# Patient Record
Sex: Male | Born: 1975 | Race: White | Hispanic: No | Marital: Single | Smoking: Current every day smoker
Health system: Southern US, Community
[De-identification: ages and names within clinical notes are randomized; demographics above are authoritative.]

## PROBLEM LIST (undated history)

## (undated) DIAGNOSIS — S069XAA Unspecified intracranial injury with loss of consciousness status unknown, initial encounter: Secondary | ICD-10-CM

## (undated) DIAGNOSIS — S069X9A Unspecified intracranial injury with loss of consciousness of unspecified duration, initial encounter: Secondary | ICD-10-CM

---

## 2016-10-20 ENCOUNTER — Emergency Department (HOSPITAL_COMMUNITY)
Admission: EM | Admit: 2016-10-20 | Discharge: 2016-10-21 | Disposition: A | Discharge status: Home / Self Care | Payer: Self-pay | Attending: Emergency Medicine | Admitting: Emergency Medicine

## 2016-10-20 ENCOUNTER — Encounter (HOSPITAL_COMMUNITY): Payer: Self-pay | Admitting: Emergency Medicine

## 2016-10-20 ENCOUNTER — Ambulatory Visit (HOSPITAL_COMMUNITY)
Admission: RE | Admit: 2016-10-20 | Discharge: 2016-10-20 | Disposition: A | Discharge status: Home / Self Care | Payer: Self-pay | Attending: Psychiatry | Admitting: Psychiatry

## 2016-10-20 DIAGNOSIS — F1919 Other psychoactive substance abuse with unspecified psychoactive substance-induced disorder: Secondary | ICD-10-CM | POA: Insufficient documentation

## 2016-10-20 DIAGNOSIS — F329 Major depressive disorder, single episode, unspecified: Secondary | ICD-10-CM | POA: Insufficient documentation

## 2016-10-20 DIAGNOSIS — Z1389 Encounter for screening for other disorder: Secondary | ICD-10-CM | POA: Insufficient documentation

## 2016-10-20 DIAGNOSIS — F1310 Sedative, hypnotic or anxiolytic abuse, uncomplicated: Secondary | ICD-10-CM | POA: Insufficient documentation

## 2016-10-20 DIAGNOSIS — F1390 Sedative, hypnotic, or anxiolytic use, unspecified, uncomplicated: Secondary | ICD-10-CM | POA: Diagnosis present

## 2016-10-20 DIAGNOSIS — F17200 Nicotine dependence, unspecified, uncomplicated: Secondary | ICD-10-CM | POA: Insufficient documentation

## 2016-10-20 DIAGNOSIS — Z79899 Other long term (current) drug therapy: Secondary | ICD-10-CM | POA: Insufficient documentation

## 2016-10-20 DIAGNOSIS — F1994 Other psychoactive substance use, unspecified with psychoactive substance-induced mood disorder: Secondary | ICD-10-CM | POA: Insufficient documentation

## 2016-10-20 DIAGNOSIS — F1910 Other psychoactive substance abuse, uncomplicated: Secondary | ICD-10-CM

## 2016-10-20 HISTORY — DX: Unspecified intracranial injury with loss of consciousness of unspecified duration, initial encounter: S06.9X9A

## 2016-10-20 HISTORY — DX: Unspecified intracranial injury with loss of consciousness status unknown, initial encounter: S06.9XAA

## 2016-10-20 LAB — COMPREHENSIVE METABOLIC PANEL
ALT: 23 U/L (ref 17–63)
AST: 59 U/L — ABNORMAL HIGH (ref 15–41)
Albumin: 4.1 g/dL (ref 3.5–5.0)
Alkaline Phosphatase: 103 U/L (ref 38–126)
Anion gap: 10 (ref 5–15)
BILIRUBIN TOTAL: 0.5 mg/dL (ref 0.3–1.2)
BUN: 13 mg/dL (ref 6–20)
CHLORIDE: 110 mmol/L (ref 101–111)
CO2: 21 mmol/L — ABNORMAL LOW (ref 22–32)
CREATININE: 1.01 mg/dL (ref 0.61–1.24)
Calcium: 9.2 mg/dL (ref 8.9–10.3)
Glucose, Bld: 110 mg/dL — ABNORMAL HIGH (ref 65–99)
Potassium: 3.3 mmol/L — ABNORMAL LOW (ref 3.5–5.1)
Sodium: 141 mmol/L (ref 135–145)
TOTAL PROTEIN: 8.1 g/dL (ref 6.5–8.1)

## 2016-10-20 LAB — CBC
HCT: 40.9 % (ref 39.0–52.0)
Hemoglobin: 13.9 g/dL (ref 13.0–17.0)
MCH: 30.5 pg (ref 26.0–34.0)
MCHC: 34 g/dL (ref 30.0–36.0)
MCV: 89.9 fL (ref 78.0–100.0)
PLATELETS: 307 10*3/uL (ref 150–400)
RBC: 4.55 MIL/uL (ref 4.22–5.81)
RDW: 13.4 % (ref 11.5–15.5)
WBC: 10.6 10*3/uL — AB (ref 4.0–10.5)

## 2016-10-20 LAB — RAPID URINE DRUG SCREEN, HOSP PERFORMED
Amphetamines: POSITIVE — AB
BARBITURATES: NOT DETECTED
BENZODIAZEPINES: POSITIVE — AB
COCAINE: NOT DETECTED
OPIATES: NOT DETECTED
Tetrahydrocannabinol: NOT DETECTED

## 2016-10-20 LAB — ACETAMINOPHEN LEVEL: Acetaminophen (Tylenol), Serum: <10 ug/mL — ABNORMAL LOW (ref 10–30)

## 2016-10-20 LAB — ETHANOL

## 2016-10-20 LAB — SALICYLATE LEVEL: Salicylate Lvl: <7 mg/dL (ref 2.8–30.0)

## 2016-10-20 NOTE — ED Notes (Signed)
Bed: [facility]4 Expected date:  Expected time:  Means of arrival:  Comments: 

## 2016-10-20 NOTE — H&P (Signed)
Behavioral Health Medical Screening Exam  Juan Smith is an 41 y.o. male.  Total Time spent with patient: 45 minutes  Psychiatric Specialty Exam: Physical Exam  Vitals reviewed. Constitutional: He is oriented to person, place, and time.  Neck: Normal range of motion.  Cardiovascular:  Tachycardia   Musculoskeletal: Normal range of motion.  Neurological: He is alert and oriented to person, place, and time.  Psychiatric: His speech is normal. His mood appears anxious. He is actively hallucinating. He expresses impulsivity. He exhibits a depressed mood. He expresses no homicidal ideation. Suicidal: Passive.    Review of Systems  Cardiovascular: Positive for palpitations. Negative for chest pain.  Neurological: Positive for tremors.  Psychiatric/Behavioral: Positive for depression, hallucinations (Voices in head "I can hear people in my head") and substance abuse (Polysubstanceabuse). Suicidal ideas: Passive; reports unintentional overdose of klonopin. The patient is nervous/anxious.   All other systems reviewed and are negative.   There were no vitals taken for this visit.There is no height or weight on file to calculate BMI.  General Appearance: Casual  Eye Contact:  Fair  Speech:  Clear and Coherent  Volume:  Decreased  Mood:  Anxious, Depressed, Hopeless and Irritable  Affect:  Congruent and Depressed  Thought Process:  Goal Directed  Orientation:  Full (Time, Place, and Person)  Thought Content:  Hallucinations: Auditory  Suicidal Thoughts:  Unintential overdose of Benzo and one Adderall  Homicidal Thoughts:  No  Memory:  Immediate;   Poor Recent;   Poor Remote;   Poor  Judgement:  Impaired  Insight:  Lacking  Psychomotor Activity:  Restlessness and Tremor  Concentration: Concentration: Fair and Attention Span: Fair  Recall:  [employer] of Knowledge:Fair  Language: Good  Akathisia:  No  Handed:  Right  AIMS (if indicated):     Assets:  Communication Skills Desire  for Improvement Social Support  Sleep:       Musculoskeletal: Strength & Muscle Tone: within normal limits Gait & Station: normal Patient leans: N/A   Assessment:  Juan Smith, 41 y.o., male patient presented to [facility] as walk in with complaints of worsening depression, polysubstance abuse, and an unintentional overdose of 30-40 Klonopin and one Adderall.  Patient seen by this provider consulted with Dr. Lucianne Muss.  On evaluation Juan Smith reports that he is hearing a bunch of people in his head and that the hallucinations are worse after he has gotten high.  States that he is tired of living like he is and he needs help.  States that he was clean for several years until this year fell off wagon and lost everything.  Unable to contract for safety states that he has more klonopin and percocet at home.  Cousin at side stating that patient was sitting in the back seat when he took the Klonopin and states that he is afraid that he is going to have a seizure or something.  Patient denies homicidal ideation.     There were no vitals taken for this visit.  Recommendations: Inpatient psychiatric treatment when medically cleared.    Based on my evaluation the patient appears to have an emergency medical condition for which I recommend the patient be transferred to the emergency department for further evaluation.  Rankin, Shuvon, NP 10/20/2016, 3:08 PM

## 2016-10-20 NOTE — BH Assessment (Signed)
Assessment Note  Juan Smith is a 41 y.o. male who presents voluntarily as a walk in. Pt denies active SI, HI, AVH. Pt admits to uncontrollable drug use and reports taking 20 clonopine last night and 10 this morning in order to get high. Pt is actively shaking and presents with depressed and irritable affect.   Diagnosis: Polysubstance use d/o  Past Medical History: No past medical history on file.  No past surgical history on file.  Family History: No family history on file.  Social History:  has no tobacco, alcohol, and drug history on file.  Additional Social History:  Alcohol / Drug Use Pain Medications: denies Prescriptions: denies Over the Counter: denies History of alcohol / drug use?: Yes Substance #1 Name of Substance 1: Pt uses any and all drugs that he can get access to. He reports adderall, heroin, meth, crack, cocaine, methadone, clonopine, and acid. 1 - Duration: ongoing 1 - Last Use / Amount: this morning, one  adderall   CIWA:   COWS:    Allergies: Allergies no known allergies  Home Medications:  (Not in a hospital admission)  OB/GYN Status:  No LMP for male patient.  General Assessment Data TTS Assessment: In system Is this a Tele or Face-to-Face Assessment?: Face-to-Face Is this an Initial Assessment or a Re-assessment for this encounter?: Initial Assessment Marital status: Single Living Arrangements: Alone Can pt return to current living arrangement?: Yes Admission Status: Voluntary Is patient capable of signing voluntary admission?: Yes Referral Source: Self/Family/Friend  Medical Screening Exam [facility] Walk-in ONLY) Medical Exam completed: Yes  Crisis Care Plan Living Arrangements: Alone Name of Psychiatrist: none Name of Therapist: none  Education Status Is patient currently in school?: No  Risk to self with the past 6 months Suicidal Ideation: No Has patient been a risk to self within the past 6 months prior to admission? :  Yes Suicidal Intent: No Has patient had any suicidal intent within the past 6 months prior to admission? : No Is patient at risk for suicide?: Yes Suicidal Plan?: No Has patient had any suicidal plan within the past 6 months prior to admission? : No Access to Means: Yes Specify Access to Suicidal Means: drugs What has been your use of drugs/alcohol within the last 12 months?: chronic drug use Previous Attempts/Gestures: Yes Triggers for Past Attempts: Unknown Intentional Self Injurious Behavior: None Family Suicide History: Unknown Recent stressful life event(s): Other (Comment) Persecutory voices/beliefs?: No Depression: Yes Depression Symptoms: Tearfulness, Guilt, Loss of interest in usual pleasures, Feeling worthless/self pity, Feeling angry/irritable, Despondent, Isolating Substance abuse history and/or treatment for substance abuse?: Yes Suicide prevention information given to non-admitted patients: Not applicable  Risk to Others within the past 6 months Homicidal Ideation: No Does patient have any lifetime risk of violence toward others beyond the six months prior to admission? : Yes (comment) Thoughts of Harm to Others: No Current Homicidal Intent: No Current Homicidal Plan: No Access to Homicidal Means: No History of harm to others?: Yes Assessment of Violence: In past 6-12 months Does patient have access to weapons?: No Criminal Charges Pending?: Yes Describe Pending Criminal Charges: possess drug paraphernalia, disorderly conduct, resisting officer, receiving stolen good, communicating threats, simple assault, B&E, larceny after B&E, larceny of firearm, larceny of motor vehicle Does patient have a court date: Yes Court Date: 12/20/16 Is patient on probation?: Unknown  Psychosis Hallucinations: Auditory, Visual (only when high) Delusions: None noted  Mental Status Report Appearance/Hygiene: Disheveled Eye Contact: Fair Motor Activity: Agitation, Restlessness,  Tremors Speech: Unremarkable Level of Consciousness: Irritable Mood: Irritable, Helpless Affect: Irritable, Sullen Anxiety Level: Moderate Thought Processes: Unable to Assess Judgement: Partial Orientation: Person, Place, Situation, Time Obsessive Compulsive Thoughts/Behaviors: None  Cognitive Functioning Concentration: Fair Memory: Unable to Assess IQ: Average Insight: see judgement above Impulse Control: Unable to Assess Sleep: Unable to Assess Vegetative Symptoms: None  ADLScreening [facility] Assessment Services) Patient's cognitive ability adequate to safely complete daily activities?: Yes Patient able to express need for assistance with ADLs?: Yes Independently performs ADLs?: Yes (appropriate for developmental age)  Prior Inpatient Therapy Prior Inpatient Therapy: Yes  Prior Outpatient Therapy Prior Outpatient Therapy: Yes Does patient have an ACCT team?: No Does patient have Intensive In-House Services?  : No Does patient have [facility] services? : No Does patient have P4CC services?: No  ADL Screening (condition at time of admission) Patient's cognitive ability adequate to safely complete daily activities?: Yes Is the patient deaf or have difficulty hearing?: No Does the patient have difficulty seeing, even when wearing glasses/contacts?: No Does the patient have difficulty concentrating, remembering, or making decisions?: Yes Patient able to express need for assistance with ADLs?: Yes Does the patient have difficulty dressing or bathing?: Yes Independently performs ADLs?: Yes (appropriate for developmental age) Does the patient have difficulty walking or climbing stairs?: No Weakness of Legs: None Weakness of Arms/Hands: None  Home Assistive Devices/Equipment Home Assistive Devices/Equipment: None    Abuse/Neglect Assessment (Assessment to be complete while patient is alone) Physical Abuse: Denies Verbal Abuse: Denies Sexual Abuse: Denies Exploitation of  patient/patient's resources: Denies Self-Neglect: Denies Values / Beliefs Cultural Requests During Hospitalization: None Spiritual Requests During Hospitalization: None   Advance Directives (For Healthcare) Does Patient Have a Medical Advance Directive?: No Would patient like information on creating a medical advance directive?: No - Patient declined Nutrition Screen- [facility] Adult/[facility] Patient's home diet: Regular Has the patient recently lost weight without trying?: No Has the patient been eating poorly because of a decreased appetite?: No Malnutrition Screening Tool Score: 0  Additional Information 1:1 In Past 12 Months?: No CIRT Risk: No Elopement Risk: No Does patient have medical clearance?: No     Disposition:  Disposition Initial Assessment Completed for this Encounter: Yes (consulted with Assunta Found, NP) Disposition of Patient: Inpatient treatment program Type of inpatient treatment program: Adult  On Site Evaluation by:   Reviewed with Physician:    Laddie Aquas 10/20/2016 2:17 PM

## 2016-10-20 NOTE — ED Triage Notes (Addendum)
Walk in at [facility] he has been using multiple drugs, no SI-states unable to remember things-needs medical clearance-doesn't have a bed for him at this time. Pt admits to uncontrollable drug use and reports taking 20 clonopine last night and 10 this morning in order to get high. Pt is actively shaking and presents with depressed and irritable affect. Denies SI, HI, AH, VH.

## 2016-10-20 NOTE — ED Provider Notes (Addendum)
[facility]-EMERGENCY DEPT Provider Note   CSN: [id number] Arrival date & time: 10/20/16  1424     History   Chief Complaint Chief Complaint  Patient presents with  . Medical Clearance    HPI Juan Smith is a 41 y.o. male.  Level 5 caveat for psychiatric disorder. Patient has a long history of polysubstance abuse including benzodiazepines, crack cocaine, heroin.  He was recently incarcerated for 7 months and released one week ago. He is accompanied to the ED with his cousin.  He took multiple Klonopin's last night and this morning to get high. He is depressed and irritable.  No frank suicidal ideation.      Past Medical History:  Diagnosis Date  . Brain injury (HCC)     There are no active problems to display for this patient.   History reviewed. No pertinent surgical history.     Home Medications    Prior to Admission medications   Medication Sig Start Date End Date Taking? Authorizing Provider  ibuprofen (ADVIL,MOTRIN) 200 MG tablet Take 800 mg by mouth every 6 (six) hours as needed for moderate pain.   Yes [provider]  sertraline (ZOLOFT) 100 MG tablet Take 150 mg by mouth daily.   Yes [provider]    Family History No family history on file.  Social History Social History  Substance Use Topics  . Smoking status: Current Every Day Smoker  . Smokeless tobacco: Never Used  . Alcohol use Yes     Comment: socially      Allergies   Benzoyl peroxide   Review of Systems Review of Systems  Unable to perform ROS: Psychiatric disorder  All other systems reviewed and are negative.    Physical Exam Updated Vital Signs BP (!) 148/101 (BP Location: Left Arm)   Pulse (!) 121   Temp 98.6 F (37 C) (Oral)   Resp (!) 22   SpO2 100%   Physical Exam  Constitutional: He is oriented to person, place, and time. He appears well-developed and well-nourished.  Tearful, pressured speech  HENT:  Head: Normocephalic and atraumatic.    Eyes: Conjunctivae are normal.  Neck: Neck supple.  Cardiovascular: Normal rate and regular rhythm.   Pulmonary/Chest: Effort normal and breath sounds normal.  Abdominal: Soft. Bowel sounds are normal.  Musculoskeletal: Normal range of motion.  Neurological: He is alert and oriented to person, place, and time.  Skin: Skin is warm and dry.  Psychiatric: He has a normal mood and affect. His behavior is normal.  Flight of ideas, tangential thinking.  Nursing note and vitals reviewed.    ED Treatments / Results  Labs (all labs ordered are listed, but only abnormal results are displayed) Labs Reviewed  COMPREHENSIVE METABOLIC PANEL - Abnormal; Notable for the following:       Result Value   Potassium 3.3 (*)    CO2 21 (*)    Glucose, Bld 110 (*)    AST 59 (*)    All other components within normal limits  CBC - Abnormal; Notable for the following:    WBC 10.6 (*)    All other components within normal limits  RAPID URINE DRUG SCREEN, HOSP PERFORMED - Abnormal; Notable for the following:    Benzodiazepines POSITIVE (*)    Amphetamines POSITIVE (*)    All other components within normal limits  ACETAMINOPHEN LEVEL - Abnormal; Notable for the following:    Acetaminophen (Tylenol), Serum <10 (*)    All other components within normal  limits  ETHANOL  SALICYLATE LEVEL    EKG  EKG Interpretation None       Radiology No results found.  Procedures Procedures (including critical care time)  Medications Ordered in ED Medications - No data to display   Initial Impression / Assessment and Plan / ED Course  I have reviewed the triage vital signs and the nursing notes.  Pertinent labs & imaging results that were available during my care of the patient were reviewed by me and considered in my medical decision making (see chart for details).     Patient is a long history of polysubstance abuse. He was recently incarcerated, but just releasee. He is now abusing drugs again. He  appears to be manic. Behavioral health consult.  Final Clinical Impressions(s) / ED Diagnoses   Final diagnoses:  Polysubstance abuse [facility])    New Prescriptions New Prescriptions   No medications on file     Donnetta Hutching, MD 10/20/16 Lennice Sites, MD 10/20/16 [phone]

## 2016-10-20 NOTE — ED Notes (Signed)
Bed: WA28 Expected date:  Expected time:  Means of arrival:  Comments: Triage 4  

## 2016-10-20 NOTE — ED Notes (Signed)
Walk in at [facility] he has been using multiple drugs, no SI-states unable to remember things-needs medical clearance-doesn't have a bed for him at this time

## 2016-10-21 DIAGNOSIS — F1910 Other psychoactive substance abuse, uncomplicated: Secondary | ICD-10-CM | POA: Diagnosis present

## 2016-10-21 DIAGNOSIS — F1410 Cocaine abuse, uncomplicated: Secondary | ICD-10-CM

## 2016-10-21 DIAGNOSIS — F1390 Sedative, hypnotic, or anxiolytic use, unspecified, uncomplicated: Secondary | ICD-10-CM | POA: Diagnosis present

## 2016-10-21 DIAGNOSIS — F17210 Nicotine dependence, cigarettes, uncomplicated: Secondary | ICD-10-CM

## 2016-10-21 DIAGNOSIS — F1310 Sedative, hypnotic or anxiolytic abuse, uncomplicated: Secondary | ICD-10-CM

## 2016-10-21 DIAGNOSIS — F1994 Other psychoactive substance use, unspecified with psychoactive substance-induced mood disorder: Secondary | ICD-10-CM | POA: Diagnosis present

## 2016-10-21 NOTE — Discharge Instructions (Signed)
For your behavioral health needs, you are advised to follow up with [facility]:       [facility]      [address]      [place], [place] [address]      [phone]

## 2016-10-21 NOTE — BH Assessment (Signed)
[facility] Assessment Progress Note  Per Thedore Mins, MD, this pt does not require psychiatric hospitalization at this time.  Pt is to be discharged from [facility].  He reports that he has made arrangements to follow up with [facility], and is advised to follow through with this plan.  Discharged instructions include contact information for [facility].  Pt's nurse has been notified.  Doylene Canning, MA Triage Specialist [phone]

## 2016-10-21 NOTE — Consult Note (Signed)
[place] Psychiatry Consult   Reason for Consult:  Substance abuse  Referring Physician:  EDP Patient Identification: Juan Smith MRN:  [id number] Principal Diagnosis: Substance induced mood disorder [facility]) Diagnosis:   Patient Active Problem List   Diagnosis Date Noted  . Polysubstance abuse ([place]) [F19.10] 10/21/2016    Priority: High  . 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 Substance induced mood disorder [facility]) [F19.94] 10/21/2016    Priority: High    Total Time spent with patient: 45 minutes  Subjective:   Juan Smith is a 41 y.o. male patient does not warrant admission.  HPI:  41 yo male who presented to the ED after abusing Klonopin "to get high".  No suicidal/homicidal ideations, hallucinations, or withdrawal symptoms.  Recently out of jail and has a plan to [facility] in [place] on [place] for rehab.  Stable for discharge with Peer Support Consult.  Past Psychiatric History: substance abuse  Risk to Self: Is patient at risk for suicide?: No Risk to Others:  None Prior Inpatient Therapy:  None Prior Outpatient Therapy:  Rehab  Past Medical History:  Past Medical History:  Diagnosis Date  . Brain injury [facility])    History reviewed. No pertinent surgical history. Family History: No family history on file. Family Psychiatric  History: substance abuse Social History:  History  Alcohol Use  . Yes    Comment: socially      History  Drug Use  . Types: IV, Cocaine, Methamphetamines    Comment: prescription drugs, heroin, klonopin, xanax     Social History   Social History  . Marital status: Unknown    Spouse name: N/A  . Number of children: N/A  . Years of education: N/A   Social History Main Topics  . Smoking status: Current Every Day Smoker  . Smokeless tobacco: Never Used  . Alcohol use Yes     Comment: socially   . Drug use: Yes    Types: IV, Cocaine, Methamphetamines     Comment: prescription  drugs, heroin, klonopin, xanax   . Sexual activity: Not Asked   Other Topics Concern  . None   Social History Narrative  . None   Additional Social History:    Allergies:   Allergies  Allergen Reactions  . Benzoyl Peroxide Other (See Comments)    Break out on face    Labs:  Results for orders placed or performed during the hospital encounter of 10/20/16 (from the past 48 hour(s))  Comprehensive metabolic panel     Status: Abnormal   Collection Time: 10/20/16  4:16 PM  Result Value Ref Range   Sodium 141 135 - 145 mmol/L   Potassium 3.3 (L) 3.5 - 5.1 mmol/L   Chloride 110 101 - 111 mmol/L   CO2 21 (L) 22 - 32 mmol/L   Glucose, Bld 110 (H) 65 - 99 mg/dL   BUN 13 6 - 20 mg/dL   Creatinine, Ser 1.01 0.61 - 1.24 mg/dL   Calcium 9.2 8.9 - 10.3 mg/dL   Total Protein 8.1 6.5 - 8.1 g/dL   Albumin 4.1 3.5 - 5.0 g/dL   AST 59 (H) 15 - 41 U/L   ALT 23 17 - 63 U/L   Alkaline Phosphatase 103 38 - 126 U/L   Total Bilirubin 0.5 0.3 - 1.2 mg/dL   GFR calc non Af Amer >60 >60 mL/min   GFR calc Af Amer >60 >60 mL/min    Comment: (NOTE)  The eGFR has been calculated using the CKD EPI equation. This calculation has not been validated in all clinical situations. eGFR's persistently <60 mL/min signify possible Chronic Kidney Disease.    Anion gap 10 5 - 15  Ethanol     Status: None   Collection Time: 10/20/16  4:16 PM  Result Value Ref Range   Alcohol, Ethyl (B) <10 <10 mg/dL    Comment:        LOWEST DETECTABLE LIMIT FOR SERUM ALCOHOL IS 10 mg/dL FOR MEDICAL PURPOSES ONLY Please note change in reference range.   cbc     Status: Abnormal   Collection Time: 10/20/16  4:16 PM  Result Value Ref Range   WBC 10.6 (H) 4.0 - 10.5 K/uL   RBC 4.55 4.22 - 5.81 MIL/uL   Hemoglobin 13.9 13.0 - 17.0 g/dL   HCT 40.9 39.0 - 52.0 %   MCV 89.9 78.0 - 100.0 fL   MCH 30.5 26.0 - 34.0 pg   MCHC 34.0 30.0 - 36.0 g/dL   RDW 13.4 11.5 - 15.5 %   Platelets 307 150 - 400 K/uL  Acetaminophen level      Status: Abnormal   Collection Time: 10/20/16  4:16 PM  Result Value Ref Range   Acetaminophen (Tylenol), Serum <10 (L) 10 - 30 ug/mL    Comment:        THERAPEUTIC CONCENTRATIONS VARY SIGNIFICANTLY. A RANGE OF 10-30 ug/mL MAY BE AN EFFECTIVE CONCENTRATION FOR MANY PATIENTS. HOWEVER, SOME ARE BEST TREATED AT CONCENTRATIONS OUTSIDE THIS RANGE. ACETAMINOPHEN CONCENTRATIONS >150 ug/mL AT 4 HOURS AFTER INGESTION AND >50 ug/mL AT 12 HOURS AFTER INGESTION ARE OFTEN ASSOCIATED WITH TOXIC REACTIONS.   Salicylate level     Status: None   Collection Time: 10/20/16  4:16 PM  Result Value Ref Range   Salicylate Lvl <9.8 2.8 - 30.0 mg/dL  Rapid urine drug screen (hospital performed)     Status: Abnormal   Collection Time: 10/20/16  7:01 PM  Result Value Ref Range   Opiates NONE DETECTED NONE DETECTED   Cocaine NONE DETECTED NONE DETECTED   Benzodiazepines POSITIVE (A) NONE DETECTED   Amphetamines POSITIVE (A) NONE DETECTED   Tetrahydrocannabinol NONE DETECTED NONE DETECTED   Barbiturates NONE DETECTED NONE DETECTED    Comment:        DRUG SCREEN FOR MEDICAL PURPOSES ONLY.  IF CONFIRMATION IS NEEDED FOR ANY PURPOSE, NOTIFY LAB WITHIN 5 DAYS.        LOWEST DETECTABLE LIMITS FOR URINE DRUG SCREEN Drug Class       Cutoff (ng/mL) Amphetamine      1000 Barbiturate      200 Benzodiazepine   338 Tricyclics       250 Opiates          300 Cocaine          300 THC              50     No current facility-administered medications for this encounter.    Current Outpatient Prescriptions  Medication Sig Dispense Refill  . ibuprofen (ADVIL,MOTRIN) 200 MG tablet Take 800 mg by mouth every 6 (six) hours as needed for moderate pain.    Marland Kitchen sertraline (ZOLOFT) 100 MG tablet Take 150 mg by mouth daily.      Musculoskeletal: Strength & Muscle Tone: within normal limits Gait & Station: normal Patient leans: N/A  Psychiatric Specialty Exam: Physical Exam  Constitutional: He is oriented  to person, place, and time. He  appears well-developed and well-nourished.  HENT:  Head: Normocephalic.  Neck: Normal range of motion.  Respiratory: Effort normal.  Musculoskeletal: Normal range of motion.  Neurological: He is alert and oriented to person, place, and time.  Psychiatric: He has a normal mood and affect. His speech is normal and behavior is normal. Judgment and thought content normal. Cognition and memory are normal.    Review of Systems  Psychiatric/Behavioral: Positive for substance abuse.  All other systems reviewed and are negative.   Blood pressure 108/66, pulse 67, temperature 97.6 F (36.4 C), temperature source Oral, resp. rate 20, SpO2 100 %.There is no height or weight on file to calculate BMI.  General Appearance: Casual  Eye Contact:  Good  Speech:  Normal Rate  Volume:  Normal  Mood:  Euthymic  Affect:  Congruent  Thought Process:  Coherent and Descriptions of Associations: Intact  Orientation:  Full (Time, Place, and Person)  Thought Content:  WDL and Logical  Suicidal Thoughts:  No  Homicidal Thoughts:  No  Memory:  Immediate;   Good Recent;   Good Remote;   Good  Judgement:  Fair  Insight:  Fair  Psychomotor Activity:  Normal  Concentration:  Concentration: Good and Attention Span: Good  Recall:  Good  Fund of Knowledge:  Fair  Language:  Good  Akathisia:  No  Handed:  Right  AIMS (if indicated):     Assets:  Leisure Time Physical Health Resilience Social Support  ADL's:  Intact  Cognition:  WNL  Sleep:        Treatment Plan Summary: Daily contact with patient to assess and evaluate symptoms and progress in treatment, Medication management and Plan substance induced mood disorder:  -Crisis stabilization -Medication management:  Restarted Zoloft 150 mg daily for depression -Individual and substance abuse counseling -Peer support consult  Disposition: No evidence of imminent risk to self or others at present.    Waylan Boga,  NP 10/21/2016 9:57 AM  Patient seen face-to-face for psychiatric evaluation, chart reviewed and case discussed with the physician extender and developed treatment plan. Reviewed the information documented and agree with the treatment plan. Corena Pilgrim, MD

## 2016-10-21 NOTE — [facility] Suicide Risk Assessment (Signed)
Suicide Risk Assessment  Discharge Assessment   [facility] Discharge Suicide Risk Assessment   Principal Problem: Substance induced mood disorder [facility]) Discharge Diagnoses:  Patient Active Problem List   Diagnosis Date Noted  . Polysubstance abuse (HCC) [F19.10] 10/21/2016    Priority: High  . Benzodiazepine misuse (HCC) [F13.10] 10/21/2016    Priority: High  . Substance induced mood disorder [facility]) [F19.94] 10/21/2016    Priority: High    Total Time spent with patient: 45 minutes  Musculoskeletal: Strength & Muscle Tone: within normal limits Gait & Station: normal Patient leans: N/A  Psychiatric Specialty Exam: Physical Exam  Constitutional: He is oriented to person, place, and time. He appears well-developed and well-nourished.  HENT:  Head: Normocephalic.  Neck: Normal range of motion.  Respiratory: Effort normal.  Musculoskeletal: Normal range of motion.  Neurological: He is alert and oriented to person, place, and time.  Psychiatric: He has a normal mood and affect. His speech is normal and behavior is normal. Judgment and thought content normal. Cognition and memory are normal.    Review of Systems  Psychiatric/Behavioral: Positive for substance abuse.  All other systems reviewed and are negative.   Blood pressure 108/66, pulse 67, temperature 97.6 F (36.4 C), temperature source Oral, resp. rate 20, SpO2 100 %.There is no height or weight on file to calculate BMI.  General Appearance: Casual  Eye Contact:  Good  Speech:  Normal Rate  Volume:  Normal  Mood:  Euthymic  Affect:  Congruent  Thought Process:  Coherent and Descriptions of Associations: Intact  Orientation:  Full (Time, Place, and Person)  Thought Content:  WDL and Logical  Suicidal Thoughts:  No  Homicidal Thoughts:  No  Memory:  Immediate;   Good Recent;   Good Remote;   Good  Judgement:  Fair  Insight:  Fair  Psychomotor Activity:  Normal  Concentration:  Concentration: Good and Attention Span:  Good  Recall:  Good  Fund of Knowledge:  Fair  Language:  Good  Akathisia:  No  Handed:  Right  AIMS (if indicated):     Assets:  Leisure Time Physical Health Resilience Social Support  ADL's:  Intact  Cognition:  WNL  Sleep:       Mental Status Per Nursing Assessment::   On Admission:   substance abuse  Demographic Factors:  Male and Caucasian  Loss Factors: Legal issues  Historical Factors: NA  Risk Reduction Factors:   Sense of responsibility to family, Living with another person, especially a relative and Positive social support  Continued Clinical Symptoms:  None  Cognitive Features That Contribute To Risk:  None    Suicide Risk:  Minimal: No identifiable suicidal ideation.  Patients presenting with no risk factors but with morbid ruminations; may be classified as minimal risk based on the severity of the depressive symptoms    Plan Of Care/Follow-up recommendations:  Activity:  as tolerated Diet:  heart healthy diet  LORD, JAMISON, NP 10/21/2016, 11:23 AM

## 2016-12-08 ENCOUNTER — Emergency Department (HOSPITAL_BASED_OUTPATIENT_CLINIC_OR_DEPARTMENT_OTHER): Payer: Self-pay

## 2016-12-08 ENCOUNTER — Encounter (HOSPITAL_BASED_OUTPATIENT_CLINIC_OR_DEPARTMENT_OTHER): Payer: Self-pay | Admitting: Emergency Medicine

## 2016-12-08 ENCOUNTER — Emergency Department (HOSPITAL_BASED_OUTPATIENT_CLINIC_OR_DEPARTMENT_OTHER)
Admission: EM | Admit: 2016-12-08 | Discharge: 2016-12-09 | Disposition: A | Discharge status: Home / Self Care | Payer: Self-pay | Attending: Emergency Medicine | Admitting: Emergency Medicine

## 2016-12-08 ENCOUNTER — Other Ambulatory Visit: Payer: Self-pay

## 2016-12-08 DIAGNOSIS — K529 Noninfective gastroenteritis and colitis, unspecified: Secondary | ICD-10-CM | POA: Insufficient documentation

## 2016-12-08 DIAGNOSIS — R05 Cough: Secondary | ICD-10-CM | POA: Insufficient documentation

## 2016-12-08 DIAGNOSIS — N50819 Testicular pain, unspecified: Secondary | ICD-10-CM | POA: Insufficient documentation

## 2016-12-08 DIAGNOSIS — R197 Diarrhea, unspecified: Secondary | ICD-10-CM | POA: Insufficient documentation

## 2016-12-08 DIAGNOSIS — F17200 Nicotine dependence, unspecified, uncomplicated: Secondary | ICD-10-CM | POA: Insufficient documentation

## 2016-12-08 DIAGNOSIS — R110 Nausea: Secondary | ICD-10-CM | POA: Insufficient documentation

## 2016-12-08 DIAGNOSIS — R000 Tachycardia, unspecified: Secondary | ICD-10-CM | POA: Insufficient documentation

## 2016-12-08 DIAGNOSIS — R509 Fever, unspecified: Secondary | ICD-10-CM | POA: Insufficient documentation

## 2016-12-08 DIAGNOSIS — R5383 Other fatigue: Secondary | ICD-10-CM | POA: Insufficient documentation

## 2016-12-08 DIAGNOSIS — Z79899 Other long term (current) drug therapy: Secondary | ICD-10-CM | POA: Insufficient documentation

## 2016-12-08 LAB — CBC WITH DIFFERENTIAL/PLATELET
BASOS ABS: 0 10*3/uL (ref 0.0–0.1)
BASOS PCT: 0 %
Eosinophils Absolute: 0.1 10*3/uL (ref 0.0–0.7)
Eosinophils Relative: 1 %
HEMATOCRIT: 39.5 % (ref 39.0–52.0)
HEMOGLOBIN: 13.1 g/dL (ref 13.0–17.0)
LYMPHS PCT: 11 %
Lymphs Abs: 1.3 10*3/uL (ref 0.7–4.0)
MCH: 28.5 pg (ref 26.0–34.0)
MCHC: 33.2 g/dL (ref 30.0–36.0)
MCV: 85.9 fL (ref 78.0–100.0)
MONOS PCT: 21 %
Monocytes Absolute: 2.5 10*3/uL — ABNORMAL HIGH (ref 0.1–1.0)
NEUTROS ABS: 8.2 10*3/uL — AB (ref 1.7–7.7)
NEUTROS PCT: 67 %
Platelets: 366 10*3/uL (ref 150–400)
RBC: 4.6 MIL/uL (ref 4.22–5.81)
RDW: 13.2 % (ref 11.5–15.5)
WBC: 12.1 10*3/uL — ABNORMAL HIGH (ref 4.0–10.5)

## 2016-12-08 LAB — URINALYSIS, MICROSCOPIC (REFLEX)

## 2016-12-08 LAB — COMPREHENSIVE METABOLIC PANEL
ALBUMIN: 2.8 g/dL — AB (ref 3.5–5.0)
ALK PHOS: 82 U/L (ref 38–126)
ALT: 9 U/L — AB (ref 17–63)
ANION GAP: 11 (ref 5–15)
AST: 11 U/L — ABNORMAL LOW (ref 15–41)
BILIRUBIN TOTAL: 0.7 mg/dL (ref 0.3–1.2)
BUN: 11 mg/dL (ref 6–20)
CALCIUM: 8.6 mg/dL — AB (ref 8.9–10.3)
CO2: 24 mmol/L (ref 22–32)
CREATININE: 0.8 mg/dL (ref 0.61–1.24)
Chloride: 99 mmol/L — ABNORMAL LOW (ref 101–111)
GFR calc Af Amer: >60 mL/min (ref >60)
GFR calc non Af Amer: >60 mL/min (ref >60)
GLUCOSE: 119 mg/dL — AB (ref 65–99)
Potassium: 3.3 mmol/L — ABNORMAL LOW (ref 3.5–5.1)
Sodium: 134 mmol/L — ABNORMAL LOW (ref 135–145)
TOTAL PROTEIN: 7.2 g/dL (ref 6.5–8.1)

## 2016-12-08 LAB — URINALYSIS, ROUTINE W REFLEX MICROSCOPIC
Bilirubin Urine: NEGATIVE
Glucose, UA: NEGATIVE mg/dL
Ketones, ur: 15 mg/dL — AB
Leukocytes, UA: NEGATIVE
Nitrite: NEGATIVE
PROTEIN: 100 mg/dL — AB
Specific Gravity, Urine: >1.03 — ABNORMAL HIGH (ref 1.005–1.030)
pH: 6 (ref 5.0–8.0)

## 2016-12-08 LAB — PROTIME-INR
INR: 1.18
PROTHROMBIN TIME: 14.9 s (ref 11.4–15.2)

## 2016-12-08 LAB — AMMONIA: Ammonia: 23 umol/L (ref 9–35)

## 2016-12-08 LAB — MAGNESIUM: MAGNESIUM: 1.5 mg/dL — AB (ref 1.7–2.4)

## 2016-12-08 LAB — I-STAT CG4 LACTIC ACID, ED: Lactic Acid, Venous: 0.74 mmol/L (ref 0.5–1.9)

## 2016-12-08 LAB — TSH: TSH: 6.968 u[IU]/mL — AB (ref 0.350–4.500)

## 2016-12-08 LAB — LIPASE, BLOOD: LIPASE: 17 U/L (ref 11–51)

## 2016-12-08 MED ORDER — ONDANSETRON HCL 4 MG/2ML IJ SOLN
4.0000 mg | Freq: Once | INTRAMUSCULAR | Status: AC
  Administered 2016-12-08: 4 mg via INTRAVENOUS
  Filled 2016-12-08: qty 2

## 2016-12-08 MED ORDER — ACETAMINOPHEN 325 MG PO TABS
650.0000 mg | ORAL_TABLET | Freq: Once | ORAL | Status: AC | PRN
  Administered 2016-12-08: 650 mg via ORAL
  Filled 2016-12-08: qty 2

## 2016-12-08 MED ORDER — SODIUM CHLORIDE 0.9 % IV BOLUS (SEPSIS)
1000.0000 mL | Freq: Once | INTRAVENOUS | Status: AC
  Administered 2016-12-08: 1000 mL via INTRAVENOUS

## 2016-12-08 NOTE — ED Notes (Signed)
Pt. Reports to RN he has been sick for approx. 3 weeks with nausea and diarrhea.  Pt. Now reports he has testicle pain for several days he is reporting.  Pt. States he feels "just sick".  Pt. [place] SpanielSaid he [facility] .  Pt. Reports his lower abd. Is painful also.  No s/s of rash or palpable pain to abd.

## 2016-12-08 NOTE — ED Provider Notes (Signed)
[facility] EMERGENCY DEPARTMENT Provider Note   CSN: [id number] Arrival date & time: 12/08/16  1634     History   Chief Complaint Chief Complaint  Patient presents with  . Groin Pain    HPI Juan Smith is a 41 y.o. male.   The history is provided by the patient and medical records. No language interpreter was used.  Groin Pain  This is a new problem. The current episode started more than 1 week ago (4 weeks). The problem occurs constantly. The problem has been gradually worsening. Pertinent negatives include no chest pain, no abdominal pain, no headaches and no shortness of breath. Nothing aggravates the symptoms. Nothing relieves the symptoms. He has tried nothing for the symptoms. The treatment provided no relief.  Diarrhea   This is a new problem. The current episode started more than 1 week ago. The problem occurs continuously. The problem has not changed since onset.The stool consistency is described as watery. The maximum temperature recorded prior to his arrival was 101 to 101.9 F. Associated symptoms include chills, sweats and cough. Pertinent negatives include no abdominal pain, no vomiting and no headaches. He has tried nothing for the symptoms.  Cough  This is a new problem. The problem occurs every few hours. The problem has not changed since onset.The cough is non-productive. Associated symptoms include chills and sweats. Pertinent negatives include no chest pain, no headaches, no rhinorrhea, no shortness of breath and no wheezing.    Past Medical History:  Diagnosis Date  . Brain injury [facility])     Patient Active Problem List   Diagnosis Date Noted  . Polysubstance abuse (HCC) 10/21/2016  . Benzodiazepine misuse (HCC) 10/21/2016  . Substance induced mood disorder (HCC) 10/21/2016    History reviewed. No pertinent surgical history.     Home Medications    Prior to Admission medications   Medication Sig Start Date End Date Taking?  Authorizing Provider  ibuprofen (ADVIL,MOTRIN) 200 MG tablet Take 800 mg by mouth every 6 (six) hours as needed for moderate pain.    [provider]  sertraline (ZOLOFT) 100 MG tablet Take 150 mg by mouth daily.    [provider]    Family History History reviewed. No pertinent family history.  Social History Social History   Tobacco Use  . Smoking status: Current Every Day Smoker  . Smokeless tobacco: Never Used  Substance Use Topics  . Alcohol use: No    Frequency: Never    Comment: clean since sept.   . Drug use: Yes    Types: IV, Cocaine, Methamphetamines    Comment: patient is at [facility] " clean since sept"      Allergies   Benzoyl peroxide   Review of Systems Review of Systems  Constitutional: Positive for appetite change (decreased), chills, diaphoresis, fatigue, fever and unexpected weight change (decreased).  HENT: Negative for congestion and rhinorrhea.   Eyes: Negative for visual disturbance.  Respiratory: Positive for cough. Negative for choking, chest tightness, shortness of breath and wheezing.   Cardiovascular: Negative for chest pain, palpitations and leg swelling.  Gastrointestinal: Positive for diarrhea and nausea. Negative for abdominal distention, abdominal pain, constipation and vomiting.  Genitourinary: Positive for decreased urine volume and testicular pain. Negative for flank pain.  Musculoskeletal: Negative for back pain, neck pain and neck stiffness.  Skin: Negative for rash and wound.  Neurological: Negative for light-headedness, numbness and headaches.  Psychiatric/Behavioral: Negative for agitation and confusion.  All other systems reviewed and  are negative.    Physical Exam Updated Vital Signs BP 127/80 (BP Location: Right Arm)   Pulse 87   Temp (!) 100.7 F (38.2 C) (Oral)   Resp 20   Ht 5\' 10"  (1.778 m)   Wt 74.8 kg (165 lb)   SpO2 95%   BMI 23.68 kg/m   Physical Exam  Constitutional: He is oriented to  person, place, and time. He appears well-developed and well-nourished. No distress.  HENT:  Head: Normocephalic and atraumatic.  Nose: Nose normal.  Mouth/Throat: Oropharynx is clear and moist. No oropharyngeal exudate.  Eyes: Conjunctivae and EOM are normal. Pupils are equal, round, and reactive to light. No scleral icterus.  Neck: Normal range of motion.  Cardiovascular: Intact distal pulses. Tachycardia present.  No murmur heard. Pulmonary/Chest: Effort normal. No respiratory distress. He has no wheezes. He exhibits no tenderness.  Abdominal: Soft. There is no tenderness. There is no guarding.  Genitourinary: Testes normal and penis normal. Right testis shows no swelling and no tenderness. Left testis shows no swelling and no tenderness. No penile erythema or penile tenderness.  Genitourinary Comments: Minimal tenderness with palpation of each testicle.  No evidence of Fournier's gangrene.  No tenderness in the area above the groin.  No inguinal tenderness.  No erythema seen.  Musculoskeletal: He exhibits no edema or tenderness.  Neurological: He is alert and oriented to person, place, and time. No sensory deficit. He exhibits normal muscle tone.  Skin: Capillary refill takes less than 2 seconds. He is not diaphoretic. No erythema. No pallor.  Psychiatric: He has a normal mood and affect.  Nursing note and vitals reviewed.    ED Treatments / Results  Labs (all labs ordered are listed, but only abnormal results are displayed) Labs Reviewed  URINALYSIS, ROUTINE W REFLEX MICROSCOPIC - Abnormal; Notable for the following components:      Result Value   APPearance CLOUDY (*)    Specific Gravity, Urine >1.030 (*)    Hgb urine dipstick LARGE (*)    Ketones, ur 15 (*)    Protein, ur 100 (*)    All other components within normal limits  URINALYSIS, MICROSCOPIC (REFLEX) - Abnormal; Notable for the following components:   Bacteria, UA MANY (*)    Squamous Epithelial / LPF 0-5 (*)    All  other components within normal limits  CBC WITH DIFFERENTIAL/PLATELET - Abnormal; Notable for the following components:   WBC 12.1 (*)    Neutro Abs 8.2 (*)    Monocytes Absolute 2.5 (*)    All other components within normal limits  COMPREHENSIVE METABOLIC PANEL - Abnormal; Notable for the following components:   Sodium 134 (*)    Potassium 3.3 (*)    Chloride 99 (*)    Glucose, Bld 119 (*)    Calcium 8.6 (*)    Albumin 2.8 (*)    AST 11 (*)    ALT 9 (*)    All other components within normal limits  TSH - Abnormal; Notable for the following components:   TSH 6.968 (*)    All other components within normal limits  MAGNESIUM - Abnormal; Notable for the following components:   Magnesium 1.5 (*)    All other components within normal limits  URINE CULTURE  LIPASE, BLOOD  PROTIME-INR  AMMONIA  HIV ANTIBODY (ROUTINE TESTING)  I-STAT CG4 LACTIC ACID, ED  I-STAT CG4 LACTIC ACID, ED    EKG  EKG Interpretation  Date/Time:  Thursday December 08 2016 20:13:56 EST Ventricular  Rate:  91 PR Interval:    QRS Duration: 84 QT Interval:  339 QTC Calculation: 417 R Axis:   71 Text Interpretation:  Sinus rhythm Borderline T wave abnormalities No prior ECG for comparison.  No STEMI Confirmed by Theda Belfast ([id number]) on 12/08/2016 10:51:45 PM       Radiology Dg Chest 2 View  Result Date: 12/08/2016 CLINICAL DATA:  Fever and tachycardia. EXAM: CHEST  2 VIEW COMPARISON:  No comparison studies available. FINDINGS: The lungs are clear without focal pneumonia, edema, pneumothorax or pleural effusion. The cardiopericardial silhouette is within normal limits for size. The visualized bony structures of the thorax are intact. Telemetry leads overlie the chest. IMPRESSION: No active cardiopulmonary disease. Electronically Signed   By: Kennith Center M.D.   On: 12/08/2016 20:45   US Scrotum  Result Date: 12/08/2016 CLINICAL DATA:  Subacute onset of bilateral groin pain. EXAM: SCROTAL ULTRASOUND  DOPPLER ULTRASOUND OF THE TESTICLES TECHNIQUE: Complete ultrasound examination of the testicles, epididymis, and other scrotal structures was performed. Color and spectral Doppler ultrasound were also utilized to evaluate blood flow to the testicles. COMPARISON:  None. FINDINGS: Right testicle Measurements: 4.2 x 2.4 x 2.7 cm. No mass or microlithiasis visualized. Left testicle Measurements: 3.3 x 1.9 x 2.6 cm. Minimal microlithiasis is noted at the left testis. No mass visualized. Right epididymis:  Normal in size and appearance. Left epididymis: A 1.5 x 1.0 x 1.0 cm cyst is noted at the left epididymal head. Hydrocele: Moderate right and small left hydroceles are noted, with underlying debris at the right. Varicocele:  A prominent patent left-sided varicocele is noted. Pulsed Doppler interrogation of both testes demonstrates normal low resistance arterial and venous waveforms bilaterally. IMPRESSION: 1. No evidence of testicular torsion. 2. Moderate right and small left hydroceles, with underlying debris at the right. 3. 1.5 cm left epididymal head cyst. 4. Prominent patent left-sided varicocele noted. 5. Minimal microlithiasis at the left testis. Electronically Signed   By: Roanna Raider M.D.   On: 12/08/2016 22:37   [place] Art/ven Flow Abd Pelv Doppler  Result Date: 12/08/2016 CLINICAL DATA:  Subacute onset of bilateral groin pain. EXAM: SCROTAL ULTRASOUND DOPPLER ULTRASOUND OF THE TESTICLES TECHNIQUE: Complete ultrasound examination of the testicles, epididymis, and other scrotal structures was performed. Color and spectral Doppler ultrasound were also utilized to evaluate blood flow to the testicles. COMPARISON:  None. FINDINGS: Right testicle Measurements: 4.2 x 2.4 x 2.7 cm. No mass or microlithiasis visualized. Left testicle Measurements: 3.3 x 1.9 x 2.6 cm. Minimal microlithiasis is noted at the left testis. No mass visualized. Right epididymis:  Normal in size and appearance. Left epididymis: A 1.5 x  1.0 x 1.0 cm cyst is noted at the left epididymal head. Hydrocele: Moderate right and small left hydroceles are noted, with underlying debris at the right. Varicocele:  A prominent patent left-sided varicocele is noted. Pulsed Doppler interrogation of both testes demonstrates normal low resistance arterial and venous waveforms bilaterally. IMPRESSION: 1. No evidence of testicular torsion. 2. Moderate right and small left hydroceles, with underlying debris at the right. 3. 1.5 cm left epididymal head cyst. 4. Prominent patent left-sided varicocele noted. 5. Minimal microlithiasis at the left testis. Electronically Signed   By: Roanna Raider M.D.   On: 12/08/2016 22:37   Ct Renal Stone Study  Result Date: 12/09/2016 CLINICAL DATA:  Acute onset of bilateral groin pain and testicular pain. Lower back pain. Fever, nausea and diarrhea. Hematuria. Leukocytosis. EXAM: CT ABDOMEN AND PELVIS WITHOUT CONTRAST  TECHNIQUE: Multidetector CT imaging of the abdomen and pelvis was performed following the standard protocol without IV contrast. COMPARISON:  None. FINDINGS: Lower chest: Mild bibasilar atelectasis is noted. The visualized portions of the mediastinum are unremarkable. Hepatobiliary: The liver is unremarkable in appearance. The gallbladder is unremarkable in appearance. The common bile duct remains normal in caliber. Pancreas: The pancreas is within normal limits. Spleen: The spleen is unremarkable in appearance. Adrenals/Urinary Tract: The adrenal glands are unremarkable in appearance. Scattered nonobstructing bilateral renal stones measure up to 3 mm in size. There is no evidence of hydronephrosis. No obstructing ureteral stones are identified. No perinephric stranding is seen. Stomach/Bowel: The stomach is unremarkable in appearance. The small bowel is within normal limits. The appendix is normal in caliber, without evidence of appendicitis. There is wall thickening along the ascending and transverse colon, with  surrounding soft tissue inflammation. Mild soft tissue inflammation is noted about the descending and proximal sigmoid colon as well. Findings are concerning for an acute infectious or inflammatory colitis. Vascular/Lymphatic: Scattered calcification is seen along the abdominal aorta and its branches. The abdominal aorta is otherwise grossly unremarkable. The inferior vena cava is grossly unremarkable. No retroperitoneal lymphadenopathy is seen. No pelvic sidewall lymphadenopathy is identified. Reproductive: The bladder is mildly distended and grossly unremarkable. The prostate remains normal in size. Other: No additional soft tissue abnormalities are seen. Musculoskeletal: No acute osseous abnormalities are identified. The visualized musculature is unremarkable in appearance. IMPRESSION: 1. Wall thickening along the ascending and transverse colon, with surrounding soft tissue inflammation. Mild soft tissue inflammation extends about the descending and proximal sigmoid colon. Findings are concerning for an acute infectious or inflammatory colitis. 2. Scattered nonobstructing bilateral renal stones measure up to 3 mm in size. 3. Mild bibasilar atelectasis noted. Aortic Atherosclerosis (ICD10-I70.0). Electronically Signed   By: Roanna Raider M.D.   On: 12/09/2016 01:57    Procedures Procedures (including critical care time)  Medications Ordered in ED Medications  acetaminophen (TYLENOL) tablet 650 mg (650 mg Oral Given 12/08/16 1712)  sodium chloride 0.9 % bolus 1,000 mL (0 mLs Intravenous Stopped 12/08/16 2142)  ondansetron (ZOFRAN) injection 4 mg (4 mg Intravenous Given 12/08/16 2025)  ibuprofen (ADVIL,MOTRIN) tablet 800 mg (800 mg Oral Given 12/09/16 0302)  ciprofloxacin (CIPRO) tablet 500 mg (500 mg Oral Given 12/09/16 0302)  metroNIDAZOLE (FLAGYL) tablet 500 mg (500 mg Oral Given 12/09/16 0302)     Initial Impression / Assessment and Plan / ED Course  I have reviewed the triage vital signs and  the nursing notes.  Pertinent labs & imaging results that were available during my care of the patient were reviewed by me and considered in my medical decision making (see chart for details).     Juan Smith is a 41 y.o. male with a past medical history significant for polysubstance including IV drug use and prior brain injury who presents with multiple complaints including fevers, chills, nausea, diarrhea, fatigue, weight loss, decreased appetite, chills, sweats, dry cough, and groin pain.  Patient reports that for the last month he has been having all the symptoms worsening.  He reports that for the last 4 weeks he has had bilateral testicle and groin pain.  He denies any injuries to his knowledge.  He said that the pain continued to worsen prompting him to seek evaluation today.  He said that he has been having fevers and shaking chills.  He has had diarrhea for the last week and is not been able to tolerate  p.o. due to nausea.  He thinks he is dehydrated.  He is been feeling very fatigued and not wanting to do anything.  He denies vomiting.  He reports a dry cough but denies any hemoptysis.  He reports that he was tested for HIV one year ago and was negative and was tested several weeks ago at [facility] but had not heard the results yet.  He denies any headache, vision changes, or any neurologic complaints.  On exam, patient is tachycardic.  Lungs are clear and chest is nontender.  Patient's abdomen is nontender and nondistended.  With a chaperone, groin was examined.  No penile tenderness with no lesions.  No erythema.  No crepitance in the groin or lower pelvis.  Testicles were palpated with minimal tenderness.  No focal neurologic deficits.  Exam otherwise unremarkable.  Given patient's report of groin pain with tenderness on exam, ultrasound will be ordered to evaluate the testicles.  Patient will have laboratory testing to look for dehydration and electrolyte abnormalities in the setting of  his diarrhea and.  Given the patient's dry cough in the setting of fever and tachycardia, and have workup to look for pneumonia or other occult infection.  HIV testing will be repeated given his extensive IV drug history and his clinical symptoms of infection with weight loss.  Anticipate reassessment after workup.  Ultrasounds did not show evidence of Torsion or epididymitis.   Diagnostic testing returned showing normal lactic acid.  Mild leukocytosis present.  Slight hypokalemia.  Urinalysis showed hematuria but no nitrites or leukocytes.  Given patient's fever, tachycardia, diarrhea  colitis is being considered.  Given the patient's hematuria, kidney stone is also considered given some component of flank pain.  Patient will have CT scan to further evaluate.  If patient is found to have infectious colitis, will likely provide antibiotics.  Care transferred to Dr. Adela LankFloyd while awaiting CT results and reassessment.  Patient given more fluids.  Final Clinical Impressions(s) / ED Diagnoses   Final diagnoses:  Colitis  Testicular pain    ED Discharge Orders        Ordered    ciprofloxacin (CIPRO) 500 MG tablet  2 times daily     12/09/16 0227    metroNIDAZOLE (FLAGYL) 500 MG tablet  2 times daily     12/09/16 0227    ondansetron (ZOFRAN ODT) 4 MG disintegrating tablet     12/09/16 0227    ibuprofen (ADVIL,MOTRIN) 800 MG tablet  3 times daily     12/09/16 0247    acetaminophen (TYLENOL) 500 MG tablet  Every 6 hours     12/09/16 0247      Clinical Impression: 1. Colitis   2. Testicular pain     Disposition: Care transferred to Dr. Adela LankFloyd while awaiting reassessment     Sundee Garland, Canary Brimhristopher J, MD 12/09/16 [phone]

## 2016-12-08 NOTE — ED Triage Notes (Signed)
Patient reports that he has had pain to his bilateral groins, and pain up into his flank and abdominal region. Patient has had a fever at [facility] as well

## 2016-12-08 NOTE — ED Notes (Signed)
Pt. Reports he has had sores in his mouth as well as sores on his body in the last several weeks.  Pt. Also reports his tongue being sore at times.  Pt. Has no teeth.

## 2016-12-09 ENCOUNTER — Emergency Department (HOSPITAL_BASED_OUTPATIENT_CLINIC_OR_DEPARTMENT_OTHER): Payer: Self-pay

## 2016-12-09 MED ORDER — CIPROFLOXACIN HCL 500 MG PO TABS
500.0000 mg | ORAL_TABLET | Freq: Once | ORAL | Status: AC
  Administered 2016-12-09: 500 mg via ORAL
  Filled 2016-12-09: qty 1

## 2016-12-09 MED ORDER — ACETAMINOPHEN 500 MG PO TABS: 1000.0000 mg | ORAL_TABLET | Freq: Four times a day (QID) | ORAL | 0 refills | Status: AC

## 2016-12-09 MED ORDER — IBUPROFEN 800 MG PO TABS
800.0000 mg | ORAL_TABLET | Freq: Once | ORAL | Status: AC
  Administered 2016-12-09: 800 mg via ORAL
  Filled 2016-12-09: qty 1

## 2016-12-09 MED ORDER — IBUPROFEN 800 MG PO TABS: 800.0000 mg | ORAL_TABLET | Freq: Three times a day (TID) | ORAL | 0 refills | Status: AC

## 2016-12-09 MED ORDER — METRONIDAZOLE 500 MG PO TABS
500.0000 mg | ORAL_TABLET | Freq: Once | ORAL | Status: AC
  Administered 2016-12-09: 500 mg via ORAL
  Filled 2016-12-09: qty 1

## 2016-12-09 MED ORDER — CIPROFLOXACIN HCL 500 MG PO TABS: 500.0000 mg | ORAL_TABLET | Freq: Two times a day (BID) | ORAL | 0 refills | Status: AC

## 2016-12-09 MED ORDER — ONDANSETRON 4 MG PO TBDP: ORAL_TABLET | ORAL | 0 refills | Status: AC

## 2016-12-09 MED ORDER — METRONIDAZOLE 500 MG PO TABS: 500.0000 mg | ORAL_TABLET | Freq: Two times a day (BID) | ORAL | 0 refills | Status: AC

## 2016-12-09 NOTE — ED Notes (Addendum)
Pt verbalizes understanding of dc instructions and denies any further needs at this time.  Pt does not have a secure phone number for results to be called to [facility].  Encouraged pt to use MyChart if he is able to with his counselor, also gave pt phone number for [facility] to call in the next few days.  Pt has written instructions for dietary restrictions as well as several prescriptions for all meds including OTC meds.

## 2016-12-09 NOTE — ED Provider Notes (Signed)
Received the patient in signout from Dr. Rush Landmarkegeler.  Recently the patient has had pain to bilateral testicles for the past couple weeks but worsening today.  Also with bloody diarrhea and diffuse abdominal pain.  Having some fevers and chills.  Ultrasound of the testicles was negative.  Plan is to CT the abdomen pelvis and reevaluate.  CT scan with ascending and transverse colitis.  Will treat with antibiotics.  Given GI follow-up.  Patient having continued testicular pain.  Has bilateral hydroceles.  We will have him follow-up with urology.   Melene PlanFloyd, Juan Lish, DO 12/09/16 [phone]

## 2016-12-09 NOTE — Discharge Instructions (Signed)
Take 4 over the counter ibuprofen tablets 3 times a day or 2 over-the-counter naproxen tablets twice a day for pain. Also take tylenol 1000mg(2 extra strength) four times a day.    

## 2016-12-09 NOTE — ED Notes (Signed)
ED Provider at bedside. 

## 2016-12-10 LAB — URINE CULTURE: CULTURE: NO GROWTH

## 2016-12-10 LAB — HIV ANTIBODY (ROUTINE TESTING W REFLEX): HIV SCREEN 4TH GENERATION: NONREACTIVE

## 2018-07-18 IMAGING — CT CT RENAL STONE PROTOCOL
2 of 4 series · 15 of 46 positions shown, 17 images · non-contrast
Comparison: None.

CLINICAL DATA: Acute onset of bilateral groin pain and testicular
pain. Lower back pain. Fever, nausea and diarrhea. Hematuria.
Leukocytosis.

EXAM:
CT ABDOMEN AND PELVIS WITHOUT CONTRAST
TECHNIQUE: Multidetector CT imaging of the abdomen and pelvis was performed
following the standard protocol without IV contrast.

[Series 2: axial st · axial · 0.76mm/px · z∈[-496,-11]mm · 12 of 107 slices shown, 14 images]
[im 5/107  soft-tissue]
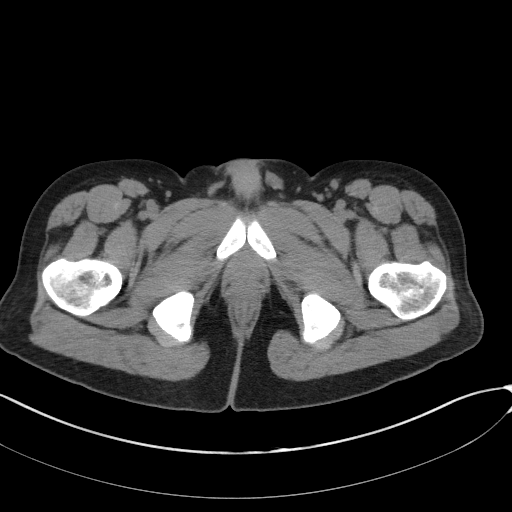
[im 5/107  bone]
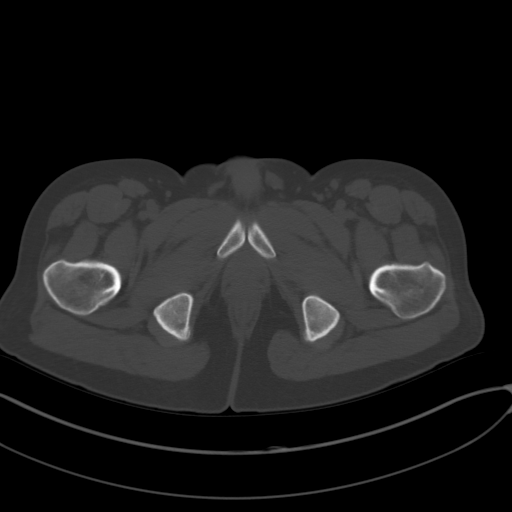
[im 14/107  soft-tissue]
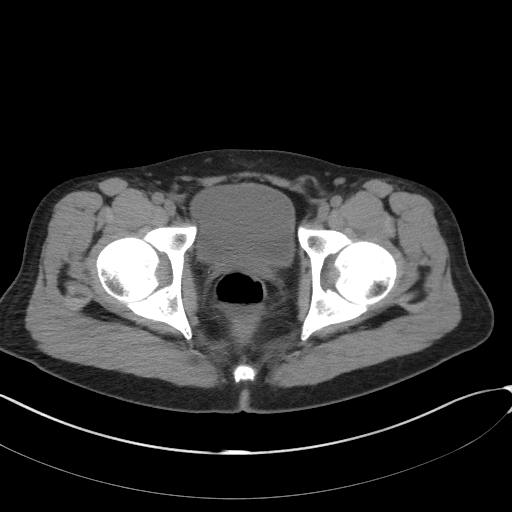
[im 23/107  soft-tissue]
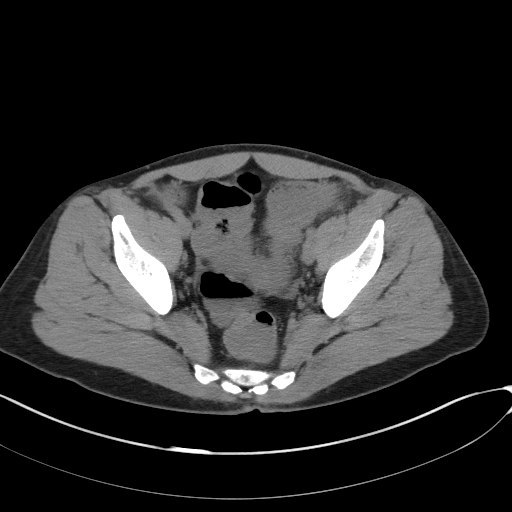
[im 31/107  soft-tissue]
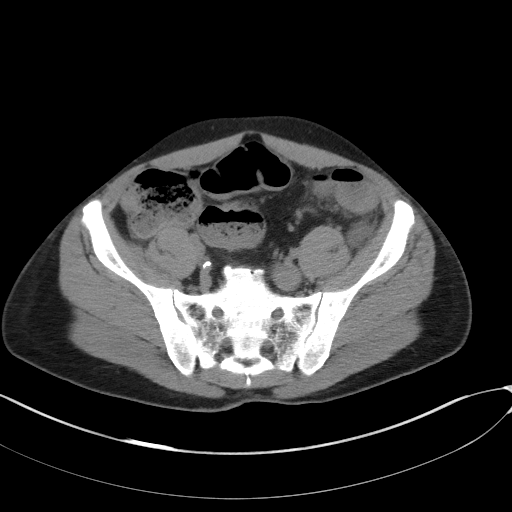
[im 40/107  soft-tissue]
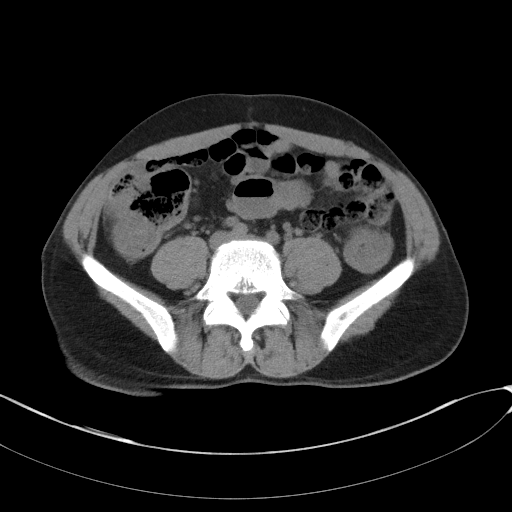
[im 49/107  soft-tissue]
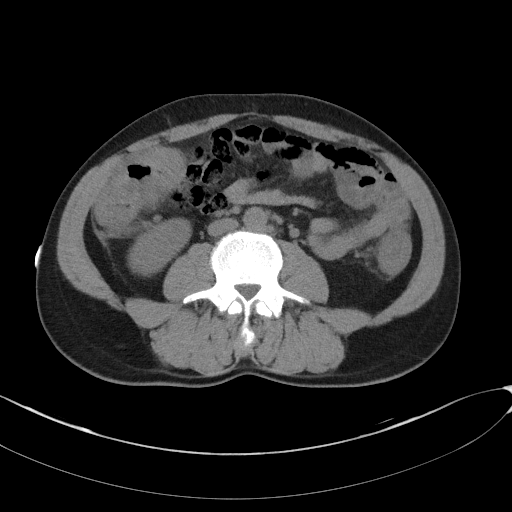
[im 58/107  soft-tissue]
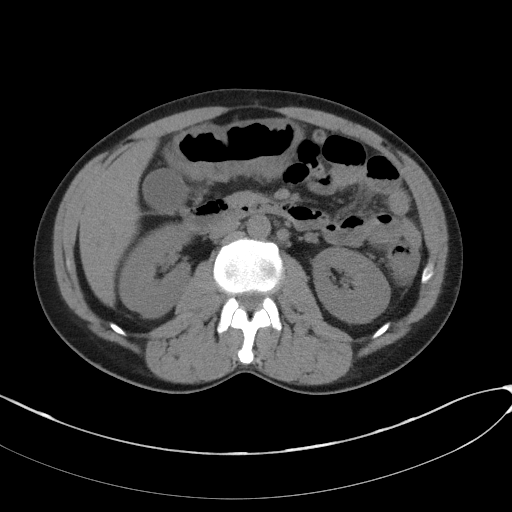
[im 67/107  soft-tissue]
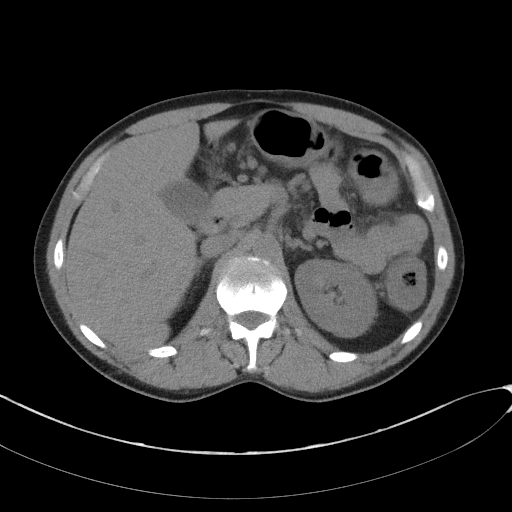
[im 76/107  soft-tissue]
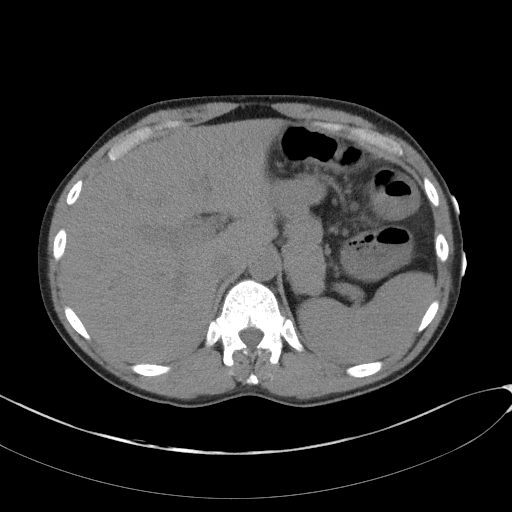
[im 76/107  bone]
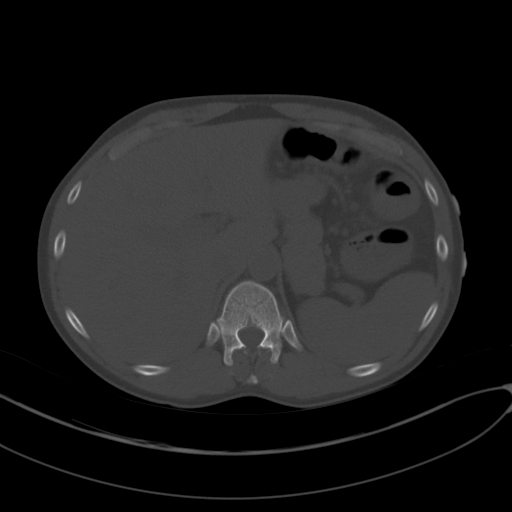
[im 84/107  soft-tissue]
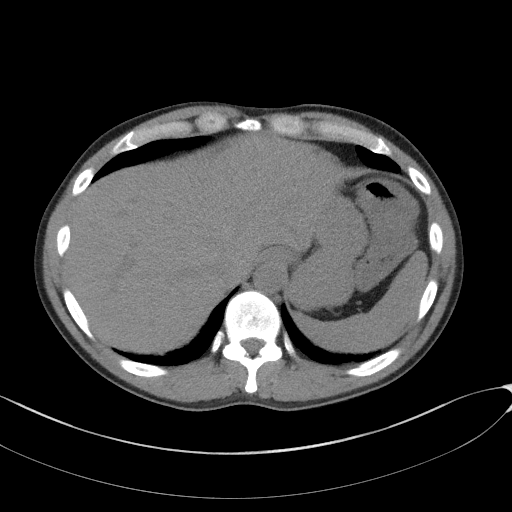
[im 93/107  soft-tissue]
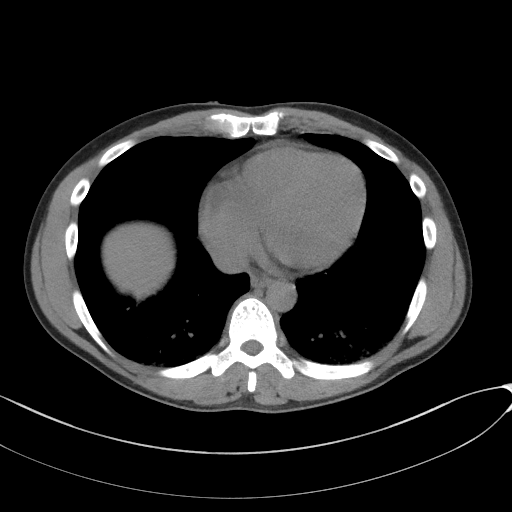
[im 102/107  soft-tissue]
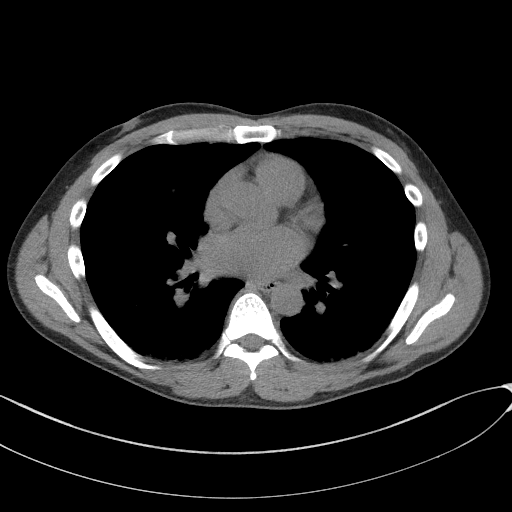

[Series 4: coronal st · coronal · 0.86mm/px · 3 of 80 slices shown]
[im 27/80  soft-tissue]
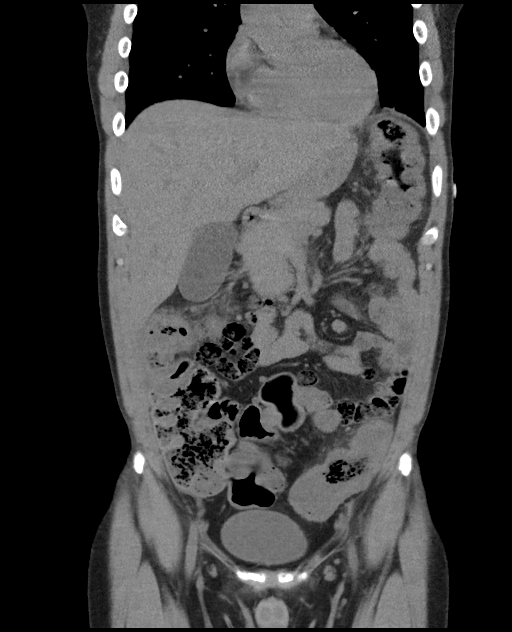
[im 36/80  soft-tissue]
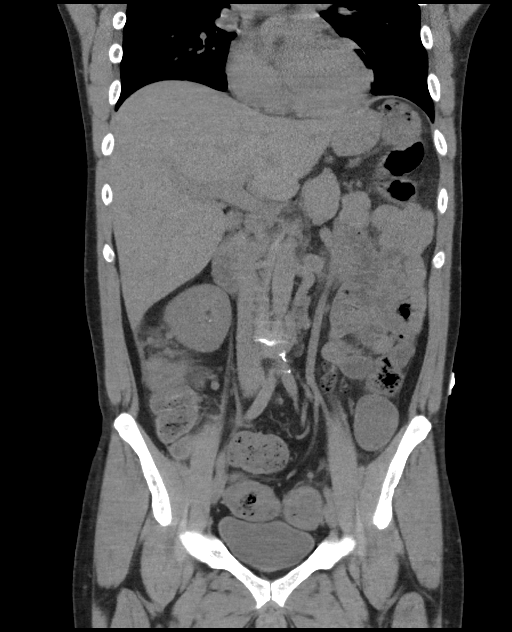
[im 44/80  soft-tissue]
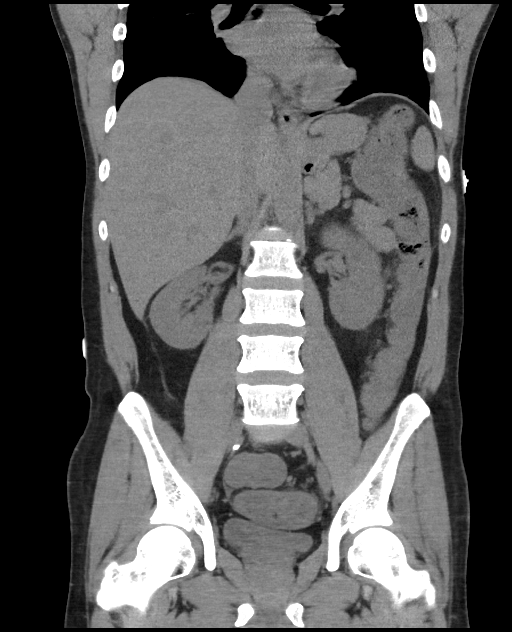

[15 of 46 positions shown; findings below may reference images not displayed]

FINDINGS: Lower chest: Mild bibasilar atelectasis is noted. The visualized
portions of the mediastinum are unremarkable.

Hepatobiliary: The liver is unremarkable in appearance. The
gallbladder is unremarkable in appearance. The common bile duct
remains normal in caliber.

Pancreas: The pancreas is within normal limits.

Spleen: The spleen is unremarkable in appearance.

Adrenals/Urinary Tract: The adrenal glands are unremarkable in
appearance.

Scattered nonobstructing bilateral renal stones measure up to 3 mm
in size. There is no evidence of hydronephrosis. No obstructing
ureteral stones are identified. No perinephric stranding is seen.

Stomach/Bowel: The stomach is unremarkable in appearance. The small
bowel is within normal limits. The appendix is normal in caliber,
without evidence of appendicitis.

There is wall thickening along the ascending and transverse colon,
with surrounding soft tissue inflammation. Mild soft tissue
inflammation is noted about the descending and proximal sigmoid
colon as well. Findings are concerning for an acute infectious or
inflammatory colitis.

Vascular/Lymphatic: Scattered calcification is seen along the
abdominal aorta and its branches. The abdominal aorta is otherwise
grossly unremarkable. The inferior vena cava is grossly
unremarkable. No retroperitoneal lymphadenopathy is seen. No pelvic
sidewall lymphadenopathy is identified.

Reproductive: The bladder is mildly distended and grossly
unremarkable. The prostate remains normal in size.

Other: No additional soft tissue abnormalities are seen.

Musculoskeletal: No acute osseous abnormalities are identified. The
visualized musculature is unremarkable in appearance.
IMPRESSION: 1. Wall thickening along the ascending and transverse colon, with
surrounding soft tissue inflammation. Mild soft tissue inflammation
extends about the descending and proximal sigmoid colon. Findings
are concerning for an acute infectious or inflammatory colitis.
2. Scattered nonobstructing bilateral renal stones measure up to 3
mm in size.
3. Mild bibasilar atelectasis noted.
Aortic Atherosclerosis ([id number]).
